# Patient Record
Sex: Male | Born: 1965 | Race: Black or African American | Hispanic: No | Marital: Single | Smoking: Former smoker
Health system: Southern US, Community
[De-identification: ages and names within clinical notes are randomized; demographics above are authoritative.]

---

## 2017-04-17 ENCOUNTER — Other Ambulatory Visit: Payer: Self-pay

## 2017-04-17 ENCOUNTER — Emergency Department
Admission: EM | Admit: 2017-04-17 | Discharge: 2017-04-17 | Disposition: A | Discharge status: Home / Self Care | Payer: Self-pay | Attending: Emergency Medicine | Admitting: Emergency Medicine

## 2017-04-17 ENCOUNTER — Emergency Department: Payer: Self-pay

## 2017-04-17 DIAGNOSIS — Z87891 Personal history of nicotine dependence: Secondary | ICD-10-CM | POA: Insufficient documentation

## 2017-04-17 DIAGNOSIS — K2900 Acute gastritis without bleeding: Secondary | ICD-10-CM | POA: Insufficient documentation

## 2017-04-17 DIAGNOSIS — R0602 Shortness of breath: Secondary | ICD-10-CM | POA: Insufficient documentation

## 2017-04-17 LAB — CBC
HEMATOCRIT: 37.7 % — AB (ref 40.0–52.0)
HEMOGLOBIN: 13 g/dL (ref 13.0–18.0)
MCH: 32.3 pg (ref 26.0–34.0)
MCHC: 34.4 g/dL (ref 32.0–36.0)
MCV: 93.9 fL (ref 80.0–100.0)
Platelets: 364 10*3/uL (ref 150–440)
RBC: 4.02 MIL/uL — ABNORMAL LOW (ref 4.40–5.90)
RDW: 15.7 % — AB (ref 11.5–14.5)
WBC: 10.2 10*3/uL (ref 3.8–10.6)

## 2017-04-17 LAB — TROPONIN I: Troponin I: <0.03 ng/mL (ref <0.03)

## 2017-04-17 LAB — BASIC METABOLIC PANEL
Anion gap: 6 (ref 5–15)
BUN: 14 mg/dL (ref 6–20)
CALCIUM: 8.9 mg/dL (ref 8.9–10.3)
CHLORIDE: 105 mmol/L (ref 101–111)
CO2: 26 mmol/L (ref 22–32)
CREATININE: 1.09 mg/dL (ref 0.61–1.24)
GFR calc non Af Amer: >60 mL/min (ref >60)
GLUCOSE: 95 mg/dL (ref 65–99)
Potassium: 3.4 mmol/L — ABNORMAL LOW (ref 3.5–5.1)
Sodium: 137 mmol/L (ref 135–145)

## 2017-04-17 MED ORDER — GI COCKTAIL ~~LOC~~
30.0000 mL | Freq: Once | ORAL | Status: AC
  Administered 2017-04-17: 30 mL via ORAL
  Filled 2017-04-17: qty 30

## 2017-04-17 MED ORDER — SUCRALFATE 1 G PO TABS: 1.0000 g | ORAL_TABLET | Freq: Four times a day (QID) | ORAL | 0 refills | Status: AC

## 2017-04-17 MED ORDER — FAMOTIDINE 40 MG PO TABS: 40.0000 mg | ORAL_TABLET | Freq: Every evening | ORAL | 1 refills | Status: AC

## 2017-04-17 NOTE — ED Notes (Signed)
Discharge instructions and RX reviewed. No questions or concerns voiced. VSS. NAD. With S/O

## 2017-04-17 NOTE — Discharge Instructions (Signed)
Please seek medical attention for any high fevers, chest pain, shortness of breath, change in behavior, persistent vomiting, bloody stool or any other new or concerning symptoms.  

## 2017-04-17 NOTE — ED Notes (Addendum)
Pt denies medical hx, denies COPD, CHF, DM or asthma. Pt denies taking daily medication. Pt states he did smoke cigarettes however is trying to quit. Pt denies CP at this time.

## 2017-04-17 NOTE — ED Provider Notes (Signed)
[facility] Emergency Department Provider Note  ____________________________________________   I have reviewed the triage vital signs and the nursing notes.   HISTORY  Chief Complaint Shortness of Breath   History limited by: Not Limited   HPI William Hahn is a 52 y.o. male who presents to the emergency department today because of concern for difficulty with breathing.  DURATION:1 day TIMING: constant SEVERITY: severe QUALITY: feels like something is caught in the throat CONTEXT: patient states that overnight he started developing shortness of breath. He states that it is worse when he tries to lie flat. Feels like something is getting caught in his throat. MODIFYING FACTORS: worse with lying flat ASSOCIATED SYMPTOMS: throat pain  Per medical record review patient has no significant past medical history.   History reviewed. No pertinent past medical history.  There are no active problems to display for this patient.   History reviewed. No pertinent surgical history.  Prior to Admission medications   Not on File    Allergies Patient has no known allergies.  No family history on file.  Social History Social History   Tobacco Use  . Smoking status: Former Games developermoker  . Smokeless tobacco: Never Used  Substance Use Topics  . Alcohol use: Yes    Alcohol/week: 0.6 oz    Types: 1 Cans of beer per week  . Drug use: Yes    Types: Marijuana    Review of Systems Constitutional: No fever/chills Eyes: No visual changes. ENT: Positive for throat discomfort.  Cardiovascular: Denies chest pain. Respiratory: Positive for shortness of breath. Gastrointestinal: No abdominal pain.  No nausea, no vomiting.  No diarrhea.   Genitourinary: Negative for dysuria. Musculoskeletal: Negative for back pain. Skin: Negative for rash. Neurological: Negative for headaches, focal weakness or  numbness.  ____________________________________________   PHYSICAL EXAM:  VITAL SIGNS: ED Triage Vitals  Enc Vitals Group     BP 04/17/17 0520 (!) 133/95     Pulse Rate 04/17/17 0520 61     Resp 04/17/17 0520 17     Temp 04/17/17 0520 97.9 F (36.6 C)     Temp Source 04/17/17 0520 Oral     SpO2 04/17/17 0520 100 %     Weight 04/17/17 0522 180 lb (81.6 kg)     Height 04/17/17 0522 5\' 8"  (1.727 m)   Constitutional: Alert and oriented. Well appearing and in no distress. Eyes: Conjunctivae are normal.  ENT   Head: Normocephalic and atraumatic.   Nose: No congestion/rhinnorhea.   Mouth/Throat: Mucous membranes are moist.   Neck: No stridor. Hematological/Lymphatic/Immunilogical: No cervical lymphadenopathy. Cardiovascular: Normal rate, regular rhythm.  No murmurs, rubs, or gallops. Respiratory: Normal respiratory effort without tachypnea nor retractions. Breath sounds are clear and equal bilaterally. No wheezes/rales/rhonchi. Gastrointestinal: Soft and non tender. No rebound. No guarding.  Genitourinary: Deferred Musculoskeletal: Normal range of motion in all extremities. No lower extremity edema. Neurologic:  Normal speech and language. No gross focal neurologic deficits are appreciated.  Skin:  Skin is warm, dry and intact. No rash noted. Psychiatric: Mood and affect are normal. Speech and behavior are normal. Patient exhibits appropriate insight and judgment.  ____________________________________________    LABS (pertinent positives/negatives)  CBC wbc 10.2, hgb 13.0  ____________________________________________   EKG  I, Phineas SemenGraydon Laddie Naeem, attending physician, personally viewed and interpreted this EKG  EKG Time: 0518 Rate: 67 Rhythm: sinus rhythm Axis: normal Intervals: qtc 408 QRS: narrow ST changes: no st elevation Impression: normal ekg   ____________________________________________  RADIOLOGY  CXR No acute  disease   ____________________________________________   PROCEDURES  Procedures  ____________________________________________   INITIAL IMPRESSION / ASSESSMENT AND PLAN / ED COURSE  Pertinent labs & imaging results that were available during my care of the patient were reviewed by me and considered in my medical decision making (see chart for details).  Presented to the emergency department today because of concerns for sure is of breath.  Differential is broad including pneumonia, pneumothorax, cardiac etiology, anemia amongst other etiologies.  Patient does state that it is worse when he lies flat and he has been complaining of more acid reflux.  Patient was given a GI cocktail and stated that he felt like it helped.  I do wonder if part of the patient's problem is acid reflux and and around his throat.  Chest x-ray without any concerning findings.  Discussed with patient thought that it is likely secondary to acid reflux.  Will give prescription for Pepcid and sucralfate.  ____________________________________________   FINAL CLINICAL IMPRESSION(S) / ED DIAGNOSES  Final diagnoses:  Shortness of breath  Acute gastritis without hemorrhage, unspecified gastritis type     Note: This dictation was prepared with Dragon dictation. Any transcriptional errors that result from this process are unintentional     Phineas Semen, MD 04/17/17 [id number]

## 2017-04-17 NOTE — ED Triage Notes (Signed)
[facility] down to sleep and reports he wakes up and has to jump up due to feeling short of breath. Pt also reports cough this am as well. Pt A&O and in NAD at this time. Pt denies CP.

## 2019-06-30 IMAGING — DX DG CHEST 1V PORT
1 series · 2 of 2 positions shown · non-contrast
Comparison: None.

CLINICAL DATA: Initial evaluation for acute shortness of breath.

EXAM:
PORTABLE CHEST 1 VIEW

[Series 1: chest ap · 0.14mm/px · 2 of 2 slices shown]
[im 1/2]
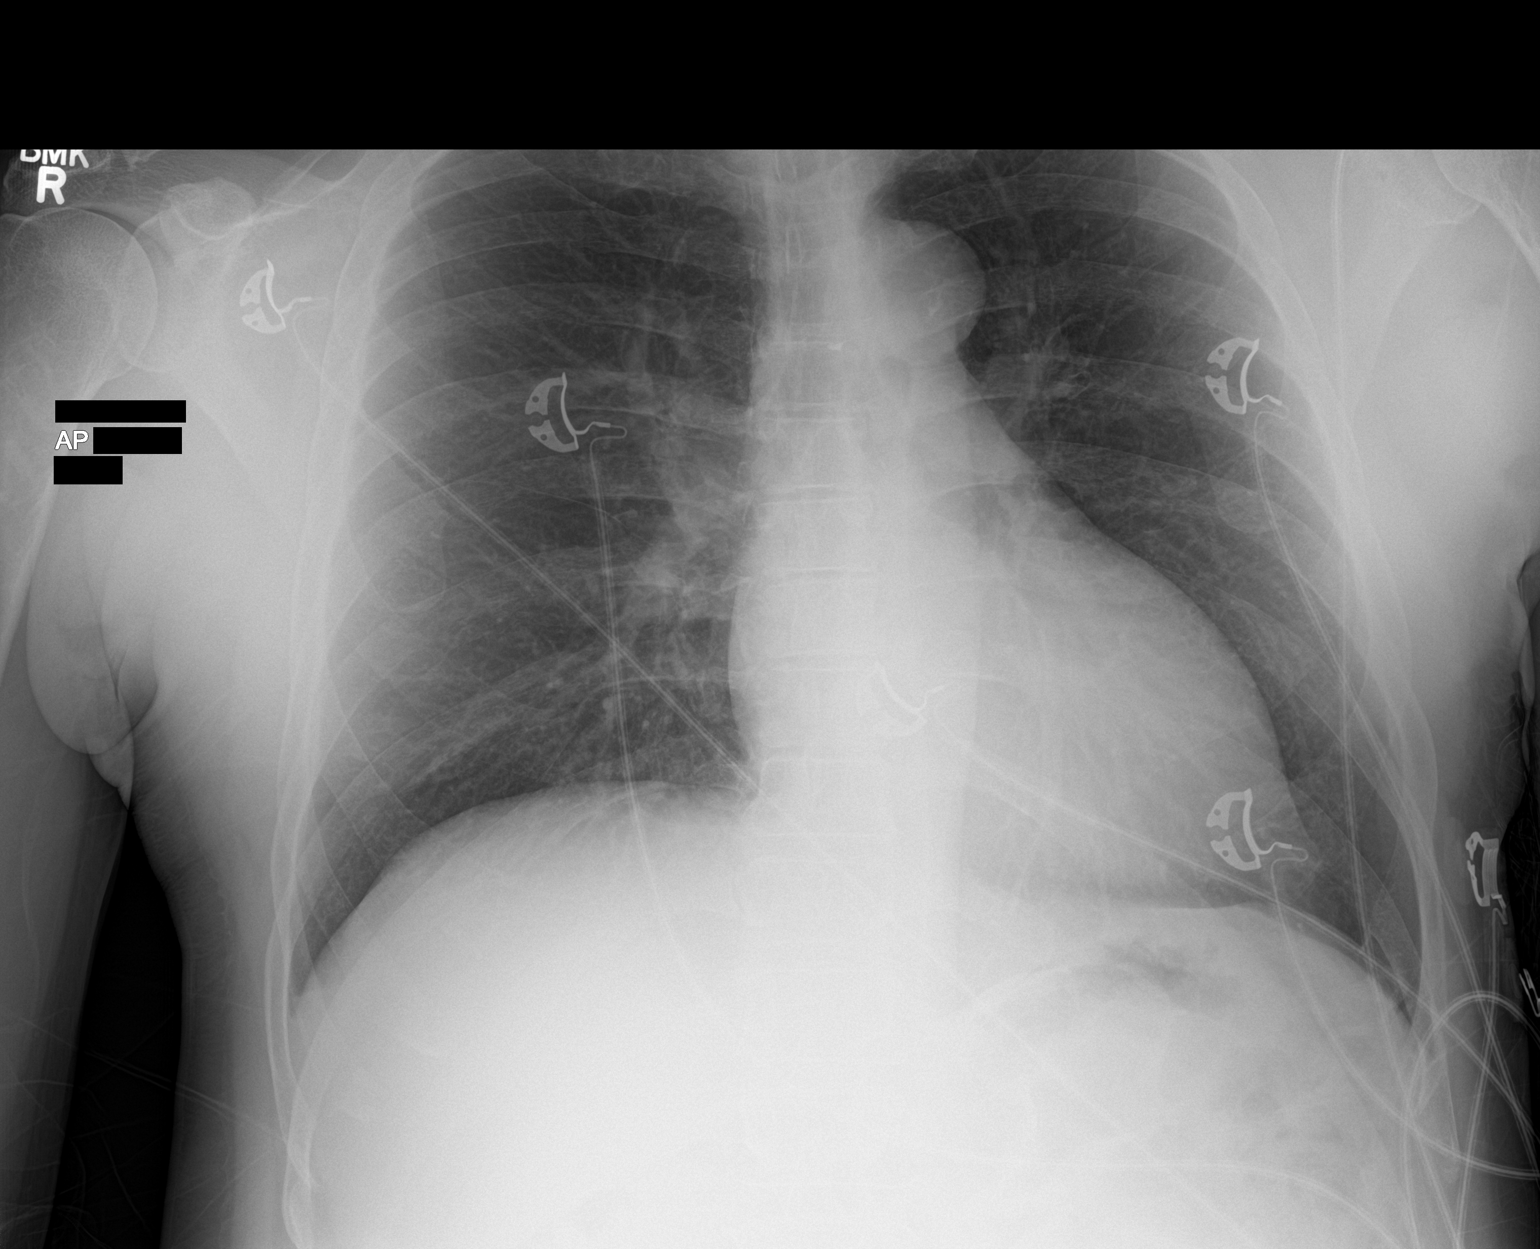
[im 2/2]
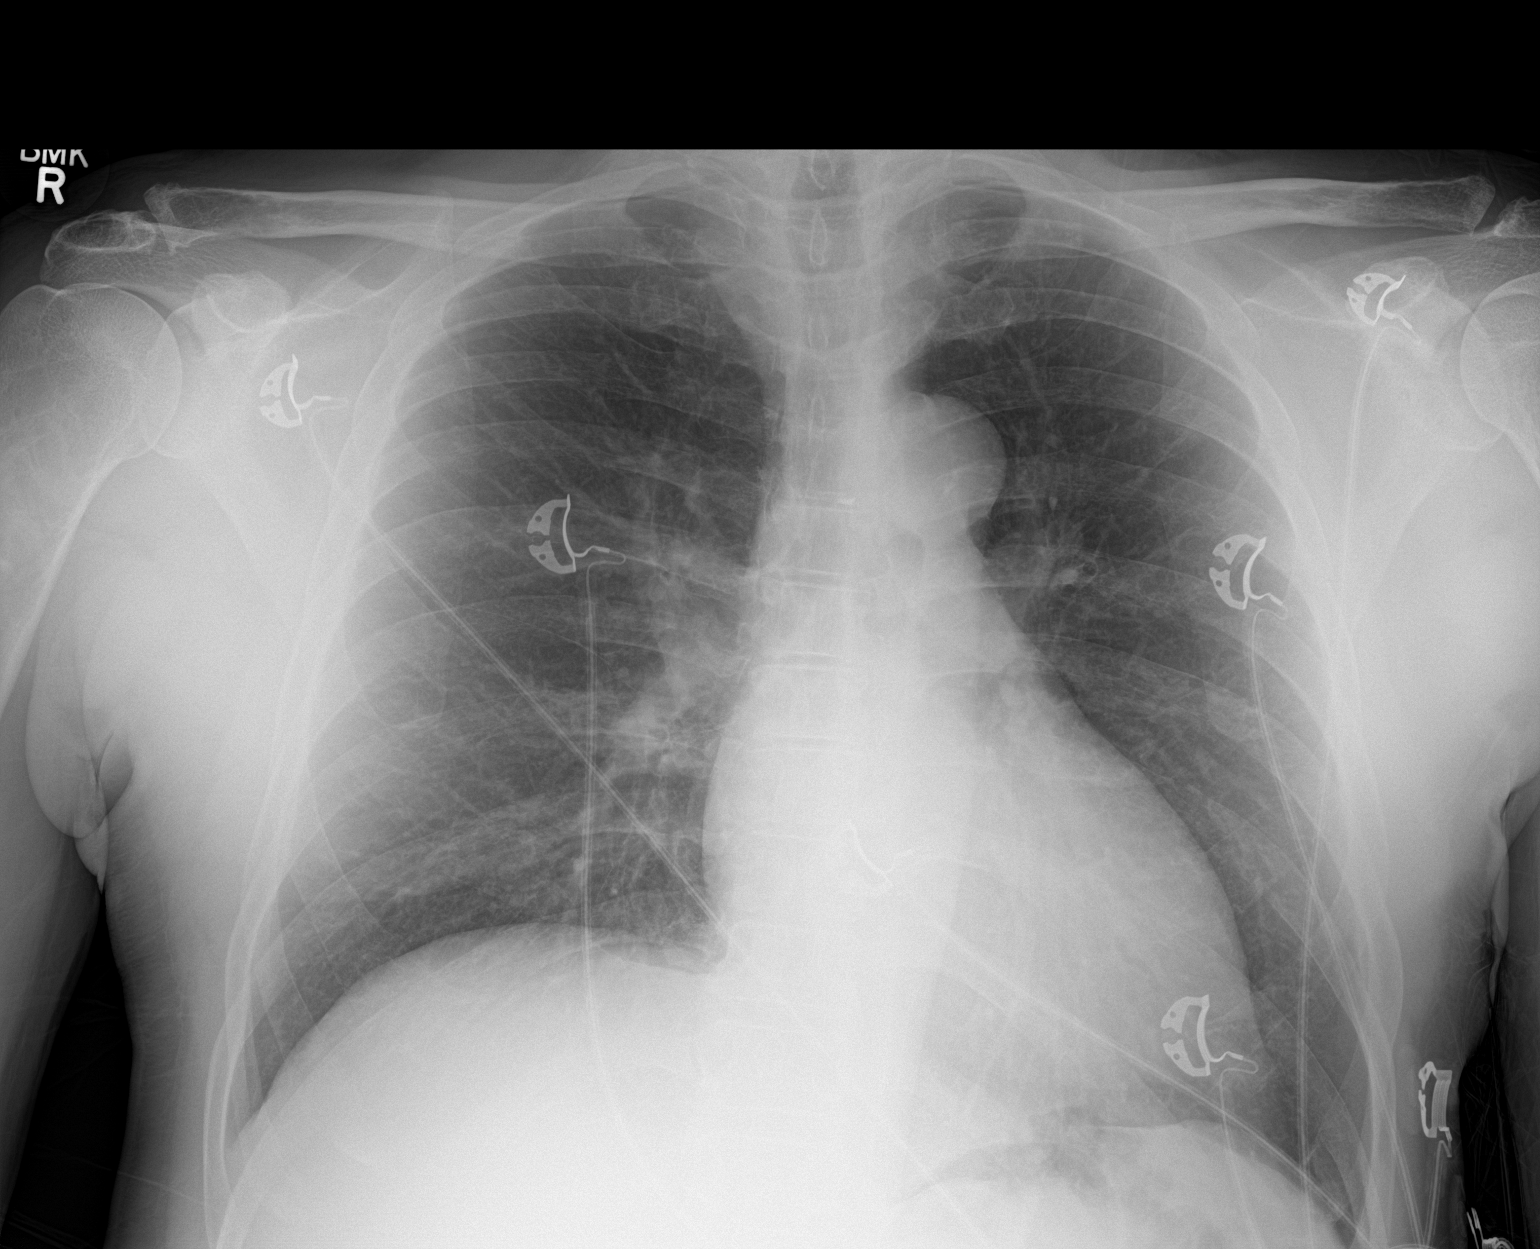

[2 of 2 positions shown; findings below may reference images not displayed]

FINDINGS: The cardiac and mediastinal silhouettes are within normal limits.

The lungs are normally inflated. No airspace consolidation, pleural
effusion, or pulmonary edema is identified. There is no
pneumothorax.

No acute osseous abnormality identified.
IMPRESSION: No active disease.

## 2022-01-12 DEATH — deceased
